# Patient Record
Sex: Male | Born: 1945 | Hispanic: No | Marital: Married | Smoking: Never smoker
Health system: Southern US, Community
[De-identification: ages and names within clinical notes are randomized; demographics above are authoritative.]

## PROBLEM LIST (undated history)

## (undated) DIAGNOSIS — M1990 Unspecified osteoarthritis, unspecified site: Secondary | ICD-10-CM

## (undated) DIAGNOSIS — K2210 Ulcer of esophagus without bleeding: Secondary | ICD-10-CM

## (undated) DIAGNOSIS — I2510 Atherosclerotic heart disease of native coronary artery without angina pectoris: Secondary | ICD-10-CM

## (undated) HISTORY — PX: BYPASS GRAFT: SHX909

## (undated) HISTORY — PX: PROSTATECTOMY: SHX69

## (undated) HISTORY — PX: CORONARY ANGIOPLASTY WITH STENT PLACEMENT: SHX49

---

## 2012-07-12 ENCOUNTER — Emergency Department (HOSPITAL_COMMUNITY)
Admission: EM | Admit: 2012-07-12 | Discharge: 2012-07-12 | Disposition: A | Discharge status: Home / Self Care | Payer: Medicare Other | Attending: Emergency Medicine | Admitting: Emergency Medicine

## 2012-07-12 ENCOUNTER — Encounter (HOSPITAL_COMMUNITY): Payer: Self-pay | Admitting: Emergency Medicine

## 2012-07-12 ENCOUNTER — Emergency Department (HOSPITAL_COMMUNITY): Payer: Medicare Other

## 2012-07-12 DIAGNOSIS — Z8739 Personal history of other diseases of the musculoskeletal system and connective tissue: Secondary | ICD-10-CM | POA: Insufficient documentation

## 2012-07-12 DIAGNOSIS — I2510 Atherosclerotic heart disease of native coronary artery without angina pectoris: Secondary | ICD-10-CM | POA: Insufficient documentation

## 2012-07-12 DIAGNOSIS — R112 Nausea with vomiting, unspecified: Secondary | ICD-10-CM | POA: Insufficient documentation

## 2012-07-12 DIAGNOSIS — Z8719 Personal history of other diseases of the digestive system: Secondary | ICD-10-CM | POA: Insufficient documentation

## 2012-07-12 DIAGNOSIS — Z951 Presence of aortocoronary bypass graft: Secondary | ICD-10-CM | POA: Insufficient documentation

## 2012-07-12 DIAGNOSIS — K8020 Calculus of gallbladder without cholecystitis without obstruction: Secondary | ICD-10-CM | POA: Insufficient documentation

## 2012-07-12 HISTORY — DX: Unspecified osteoarthritis, unspecified site: M19.90

## 2012-07-12 HISTORY — DX: Atherosclerotic heart disease of native coronary artery without angina pectoris: I25.10

## 2012-07-12 HISTORY — DX: Ulcer of esophagus without bleeding: K22.10

## 2012-07-12 LAB — CBC WITH DIFFERENTIAL/PLATELET
Basophils Relative: 0 % (ref 0–1)
Eosinophils Absolute: 0.2 10*3/uL (ref 0.0–0.7)
Eosinophils Relative: 2 % (ref 0–5)
HCT: 40.5 % (ref 39.0–52.0)
Hemoglobin: 13.4 g/dL (ref 13.0–17.0)
Lymphs Abs: 1.5 10*3/uL (ref 0.7–4.0)
MCH: 29.7 pg (ref 26.0–34.0)
MCHC: 33.1 g/dL (ref 30.0–36.0)
MCV: 89.8 fL (ref 78.0–100.0)
Monocytes Absolute: 0.5 10*3/uL (ref 0.1–1.0)
Monocytes Relative: 6 % (ref 3–12)
Neutrophils Relative %: 75 % (ref 43–77)
RBC: 4.51 MIL/uL (ref 4.22–5.81)

## 2012-07-12 LAB — COMPREHENSIVE METABOLIC PANEL
Albumin: 3.6 g/dL (ref 3.5–5.2)
Alkaline Phosphatase: 96 U/L (ref 39–117)
BUN: 18 mg/dL (ref 6–23)
Calcium: 8.7 mg/dL (ref 8.4–10.5)
Creatinine, Ser: 1.31 mg/dL (ref 0.50–1.35)
GFR calc Af Amer: 63 mL/min — ABNORMAL LOW (ref >90)
Glucose, Bld: 168 mg/dL — ABNORMAL HIGH (ref 70–99)
Potassium: 3.7 mEq/L (ref 3.5–5.1)
Total Protein: 6.4 g/dL (ref 6.0–8.3)

## 2012-07-12 LAB — URINALYSIS, ROUTINE W REFLEX MICROSCOPIC
Bilirubin Urine: NEGATIVE
Leukocytes, UA: NEGATIVE
Nitrite: NEGATIVE
Specific Gravity, Urine: 1.018 (ref 1.005–1.030)
Urobilinogen, UA: 0.2 mg/dL (ref 0.0–1.0)
pH: 5.5 (ref 5.0–8.0)

## 2012-07-12 LAB — LIPASE, BLOOD: Lipase: 38 U/L (ref 11–59)

## 2012-07-12 LAB — PROTIME-INR: Prothrombin Time: 19.7 seconds — ABNORMAL HIGH (ref 11.6–15.2)

## 2012-07-12 MED ORDER — MORPHINE SULFATE 2 MG/ML IJ SOLN
2.0000 mg | Freq: Once | INTRAMUSCULAR | Status: AC
  Administered 2012-07-12: 2 mg via INTRAVENOUS
  Filled 2012-07-12: qty 1

## 2012-07-12 MED ORDER — IOHEXOL 300 MG/ML  SOLN
50.0000 mL | Freq: Once | INTRAMUSCULAR | Status: AC | PRN
  Administered 2012-07-12: 50 mL via ORAL

## 2012-07-12 MED ORDER — OXYCODONE-ACETAMINOPHEN 5-325 MG PO TABS: 1.0000 | ORAL_TABLET | ORAL | Status: AC | PRN

## 2012-07-12 MED ORDER — GI COCKTAIL ~~LOC~~
30.0000 mL | Freq: Once | ORAL | Status: AC
  Administered 2012-07-12: 30 mL via ORAL
  Filled 2012-07-12: qty 30

## 2012-07-12 MED ORDER — ONDANSETRON HCL 4 MG/2ML IJ SOLN
4.0000 mg | Freq: Once | INTRAMUSCULAR | Status: AC
  Administered 2012-07-12: 4 mg via INTRAVENOUS
  Filled 2012-07-12: qty 2

## 2012-07-12 MED ORDER — SODIUM CHLORIDE 0.9 % IV SOLN
1000.0000 mL | Freq: Once | INTRAVENOUS | Status: AC
  Administered 2012-07-12: 1000 mL via INTRAVENOUS

## 2012-07-12 MED ORDER — HYDROCODONE-ACETAMINOPHEN 5-325 MG PO TABS: 1.0000 | ORAL_TABLET | Freq: Three times a day (TID) | ORAL | Status: AC | PRN

## 2012-07-12 MED ORDER — IOHEXOL 300 MG/ML  SOLN
80.0000 mL | Freq: Once | INTRAMUSCULAR | Status: AC | PRN
  Administered 2012-07-12: 80 mL via INTRAVENOUS

## 2012-07-12 NOTE — ED Notes (Signed)
Patient transported to CT 

## 2012-07-12 NOTE — ED Notes (Signed)
PT. REPORTS MID ABDOMINAL PAIN WITH VOMITTING ONSET LAST Sunday AND DIARRHEA YESTERDAY , CURRENTLY TAKING CIPRO AND FLAGYL ANTIBIOTIC PRESCRIBED AT AN URGENT CARE CLINIC.

## 2012-07-12 NOTE — ED Provider Notes (Signed)
History     CSN: [id number]  Arrival date & time 07/12/12  0409   First MD Initiated Contact with Patient 07/12/12 [phone]      Chief Complaint  Patient presents with  . Abdominal Pain    (Consider location/radiation/quality/duration/timing/severity/associated sxs/prior treatment) HPI The patient presents with concerns of abdominal pain, nausea, vomiting. Symptoms began approximately 72 hours ago, initially with crampy abdominal pain.  Subsequently the patient had multiple episodes of emesis and diarrhea.  That day he was seen at an urgent care center.  He was started on ciprofloxacin, Flagyl. He states that in the subsequent 2 days he continues to have crampy epigastric, left upper quadrant abdominal pain.  His appetite is improving minimally.  He continues to have nausea.  He has had 2 subsequent episodes of loose stool. He has had subjective fever, but no objective fever. No chest pain, no dyspnea, no lower extremity edema, no rash, no confusion, no disorientation. He is a distant history of appendectomy. He notes that his pain is improved since the initiation of medication, without other clear alleviating factors. There are no clear exacerbating factors.  Note the patient has a history of CAD, including with stent placement and bypass surgery.  For both of those events, the patient no chest pain, but did have abdominal pain. Past Medical History  Diagnosis Date  . DJD (degenerative joint disease)   . Erosive esophagitis     History reviewed. No pertinent past surgical history.  No family history on file.  History  Substance Use Topics  . Smoking status: Never Smoker   . Smokeless tobacco: Not on file  . Alcohol Use: No      Review of Systems  Constitutional:       Per HPI, otherwise negative  HENT:       Per HPI, otherwise negative  Eyes: Negative.   Respiratory:       Per HPI, otherwise negative  Cardiovascular:       Per HPI, otherwise negative    Gastrointestinal: Positive for nausea, vomiting and diarrhea.  Genitourinary: Negative.   Musculoskeletal:       Per HPI, otherwise negative  Skin: Negative.   Neurological: Negative for syncope.    Allergies  Review of patient's allergies indicates not on file.  Home Medications  No current outpatient prescriptions on file.  BP 133/67  Pulse 48  Temp 97.5 F (36.4 C) (Oral)  Resp 16  SpO2 100%  Physical Exam  Nursing note and vitals reviewed. Constitutional: He is oriented to person, place, and time. He appears well-developed. No distress.  HENT:  Head: Normocephalic and atraumatic.  Eyes: Conjunctivae normal and EOM are normal.  Cardiovascular: Normal rate and regular rhythm.   Pulmonary/Chest: Effort normal. No stridor. No respiratory distress.  Abdominal: He exhibits no distension.  Musculoskeletal: He exhibits no edema.  Neurological: He is alert and oriented to person, place, and time.  Skin: Skin is warm and dry.  Psychiatric: He has a normal mood and affect.    ED Course  Procedures (including critical care time)   Labs Reviewed  CBC WITH DIFFERENTIAL  COMPREHENSIVE METABOLIC PANEL  LIPASE, BLOOD  URINALYSIS, ROUTINE W REFLEX MICROSCOPIC  TROPONIN I   No results found.   No diagnosis found.  Cardiac 48 sinus bradycardia abnormal Pulse ox 100% room air normal   Date: 07/12/2012  Rate: 48  Rhythm: sinus bradycardia  QRS Axis: left  Intervals: normal  ST/T Wave abnormalities: nonspecific T wave changes  Conduction Disutrbances:none  Narrative Interpretation:   Old EKG Reviewed: none available ABNORMAL  Update: patient improved.  He was made aware of all results thus far.  He adds that the day prior to Sx onset he had a tuna sandwich, and subsequently felt mildly ill.  8:06 AM The patient continues to feel slightly unwell.  A review of his CT scan, which I interpreted, and demonstrated results to the patient suggest multiple ongoing issues.   Notable for today's presentation, there were abnormalities about the gallbladder.  With the change in his transaminases, ultrasound was ordered. We discussed pain management options, anti-emetics.  MDM  This patient presents with new abdominal pain, nausea, anorexia, vomiting.  On exam the patient is unwell appearing, but in no distress.  The patient has mild bradycardia, but otherwise unremarkable vital signs.  The patient is tenderness to palpation about the epigastric and left upper quadrant suggesting gastric or GI pathology.  The patient has no dysuria, no fever.  Patient's labs are notable for elevated transaminases, and a CT scan demonstrates nonspecific gallbladder changes.  Followup ultrasound was ordered.  This was largely unremarkable, the patient was discharged with analgesics, and GI F/U.        Gerhard Munch, MD 07/12/12 [phone]

## 2012-07-12 NOTE — ED Provider Notes (Addendum)
10:46 AM The patient is without a discomfort at this time.  On examination the patient has no epigastric or right upper quadrant tenderness.  Patient states he feels much better at this time.  I suspect this is biliary colic.  His white count is normal.  You will need followup with general surgery for possible cholecystectomy.  He understands return to ER for new or seen symptoms including but not limited to fever, worsening abdominal pain, severe nausea vomiting and inability to keep fluids down.  Patient is a mild elevation of his liver function tests and this may represent recently passed stone 2 as, mild left.  His common bile duct is normal size on ultrasound.  The encounter diagnosis was Cholelithiasis.  US Abdomen Complete  07/12/2012  *RADIOLOGY REPORT*  Clinical Data: Right upper quadrant pain.  ABDOMEN ULTRASOUND  Technique:  Complete abdominal ultrasound examination was performed including evaluation of the liver, gallbladder, bile ducts, pancreas, kidneys, spleen, IVC, and abdominal aorta.  Comparison: CT scan from 07/12/2012  Findings:  Gallbladder:  There is some dependent sludge within the lumen of the gallbladder.  Tiny stones measure up to about 6 mm in diameter. Gallbladder wall does not show evidence for thickening, measuring only about 2 mm.  There is a trace amount of fluid adjacent to the gallbladder.  The sonographer reports no sonographic Murphy's sign.  Common Bile Duct:  Nondilated at 6 mm diameter.  Liver:  Normal.  No focal parenchymal abnormality.  No biliary dilation.  IVC:  Normal.  Pancreas:  Normal.  Spleen:  Normal.  Right kidney:  10.8 cm in long axis.  16 mm hypoechoic lesion in the interpolar region is compatible with a cyst, as seen on the CT scan earlier today.  Left kidney:  10.6 cm in long axis.  Normal.  Abdominal Aorta:  2.9 cm in maximum diameter, borderline aneurysmal.  IMPRESSION: Gallbladder sludge with associated tiny gallstones.  Although no gallbladder wall  thickening is evident, the patient does have a trace amount of pericholecystic fluid.  The sonographer reports a negative sonographic Murphy's sign.  Overall ultrasound features are equivocal for acute cholecystitis.  If the clinical picture is nonspecific, nuclear scintigraphy may prove helpful to further evaluate.   Original Report Authenticated By: Kennith Center, M.D.    Ct Abdomen Pelvis W Contrast  07/12/2012  *RADIOLOGY REPORT*  Clinical Data: Epigastric and left upper quadrant pain, nausea, vomiting  CT ABDOMEN AND PELVIS WITH CONTRAST  Technique:  Multidetector CT imaging of the abdomen and pelvis was performed following the standard protocol during bolus administration of intravenous contrast.  Contrast: 80mL OMNIPAQUE IOHEXOL 300 MG/ML  SOLN  Comparison: None.  Findings:  Lower Chest:  Dependent atelectasis in the bilateral lower lobes. Isolated pulmonary cyst versus emphysematous bleb in the left lower lobe.  No suspicious pulmonary nodule.  Visualized cardiac structures within normal limits for size.  Atherosclerotic calcifications noted in the coronary arteries.  No pericardial effusion.  Unremarkable visualized distal esophagus.  Abdomen: Mild thickening of the anterior wall of the gastric antrum is less conspicuous on the delayed views suggest normal muscular contraction.  There is a small periampullary duodenal diverticulum. Normal for age appearance of the spleen, pancreas, adrenal glands, and liver.   Although no radiopaque cholelithiasis is identified. There is mild thickening of the anterior wall of the gallbladder. No significant extrahepatic biliary ductal dilatation or definite filling defect within the common bile duct.  There is minimal prominence of the right intrahepatic ducts of  uncertain clinical significance.  Right interpolar renal cyst.  Additional sub centimeter hypoattenuating lesions bilaterally are too small for accurate characterization but statistically likely also represent  benign cyst.  No hydronephrosis of either kidney.  No focal enhancing solid lesion.  No focal bowel wall thickening or bowel dilatation.  Although no appendix is identified in the right lower quadrant, there is no significant inflammatory change appendicitis.  No free fluid or suspicious adenopathy.  There are a few scattered colonic diverticula without evidence of active inflammation.  Pelvis: Unremarkable appearance of the bladder.  Status post prostatectomy and bilateral pelvic sidewall node dissection.  Trace fluid versus thickening in the rectovesicular recess.  Bones:  Although strictly age indeterminate without prior imaging for comparison.  Compression fractures involving the superior endplate of T10 and inferior endplate of L2 appear chronic in nature.  Remote healed left rib fractures.  Vascular: Scattered atherosclerotic vascular disease without aneurysmal dilatation or significant appearing stenosis.  No dissection or occlusion identified.  IMPRESSION:  1.  Nonspecific mild thickening of the anterior gallbladder wall without evidence of radiopaque cholelithiasis.  If there is clinical concern for underlying cholecystitis, right upper quadrant ultrasound could further evaluate.  2. Trace fluid versus thickening in the rectovesicular recess of uncertain clinical significance.  This may represent residual postoperative change from prior prostatectomy and bilateral pelvic sidewall nodal dissections.  Alternately, an early underlying infectious/inflammatory colitis could produce similar findings.  3. Compression fractures of the superior endplate of T10 and inferior endplate of L2 are strictly age indeterminate in the absence of prior imaging for comparison but appear chronic in nature.  4.  Atherosclerosis including coronary artery disease   Original Report Authenticated By: Malachy Moan, M.D.   I personally reviewed the imaging tests through PACS system I reviewed available ER/hospitalization records  through the EMR   Lyanne Co, MD 07/12/12 1047  Lyanne Co, MD 07/12/12 1051

## 2012-07-12 NOTE — ED Notes (Signed)
Patient transported to Ultrasound 

## 2012-07-12 NOTE — ED Notes (Signed)
Pt ambulated to restroom. 

## 2012-07-12 NOTE — ED Notes (Signed)
MD at bedside. 

## 2015-01-01 ENCOUNTER — Other Ambulatory Visit: Payer: Self-pay | Admitting: Geriatric Medicine

## 2015-01-01 ENCOUNTER — Ambulatory Visit
Admission: RE | Admit: 2015-01-01 | Discharge: 2015-01-01 | Disposition: A | Discharge status: Home / Self Care | Payer: Medicare Other | Source: Ambulatory Visit | Attending: Geriatric Medicine | Admitting: Geriatric Medicine

## 2015-01-01 DIAGNOSIS — M25552 Pain in left hip: Secondary | ICD-10-CM

## 2017-01-04 ENCOUNTER — Other Ambulatory Visit: Payer: Self-pay | Admitting: Specialist

## 2017-01-04 DIAGNOSIS — M7551 Bursitis of right shoulder: Secondary | ICD-10-CM

## 2017-01-15 ENCOUNTER — Ambulatory Visit
Admission: RE | Admit: 2017-01-15 | Discharge: 2017-01-15 | Disposition: A | Discharge status: Home / Self Care | Payer: Medicare Other | Source: Ambulatory Visit | Attending: Specialist | Admitting: Specialist

## 2017-01-15 DIAGNOSIS — M7551 Bursitis of right shoulder: Secondary | ICD-10-CM

## 2019-10-29 ENCOUNTER — Ambulatory Visit (INDEPENDENT_AMBULATORY_CARE_PROVIDER_SITE_OTHER): Payer: Medicare Other | Admitting: Otolaryngology

## 2019-10-29 ENCOUNTER — Encounter (INDEPENDENT_AMBULATORY_CARE_PROVIDER_SITE_OTHER): Payer: Self-pay | Admitting: Otolaryngology

## 2019-10-29 ENCOUNTER — Other Ambulatory Visit: Payer: Self-pay

## 2019-10-29 VITALS — Temp 97.9°F

## 2019-10-29 DIAGNOSIS — K219 Gastro-esophageal reflux disease without esophagitis: Secondary | ICD-10-CM

## 2019-10-29 DIAGNOSIS — J310 Chronic rhinitis: Secondary | ICD-10-CM | POA: Diagnosis not present

## 2019-10-29 DIAGNOSIS — R490 Dysphonia: Secondary | ICD-10-CM | POA: Diagnosis not present

## 2019-10-29 NOTE — Progress Notes (Signed)
HPI: Randall Watson is a 74 y.o. male who presents for evaluation of chronic hoarseness.  He apparently has had hoarseness for 7 or 8 years.  He does not smoke and quit smoking in 1977.  Denies sore throat.  He used to sing regularly but is unable to sing presently.  He had seen ENT and had fiberoptic laryngoscopy several years ago prior to Covid and they recommended speech therapy but he was unable to complete speech therapy. He denies sore throat but does have history of hiatal hernia and reflux problems. He also has history of allergies and postnasal drainage. He is presently using Claritin or Allegra.  He uses Flonase occasionally. He also takes an acid medication but not on a regular basis.  He is looking to buy omeprazole in the next couple of days. His wife wanted his voice checked because he has been chronically hoarse.  Past Medical History:  Diagnosis Date  . CAD (coronary artery disease)   . DJD (degenerative joint disease)   . Erosive esophagitis    Past Surgical History:  Procedure Laterality Date  . BYPASS GRAFT     triple bypass  . CORONARY ANGIOPLASTY WITH STENT PLACEMENT    . PROSTATECTOMY     Social History   Socioeconomic History  . Marital status: Married    Spouse name: Not on file  . Number of children: Not on file  . Years of education: Not on file  . Highest education level: Not on file  Occupational History  . Not on file  Tobacco Use  . Smoking status: Never Smoker  . Smokeless tobacco: Never Used  Substance and Sexual Activity  . Alcohol use: No  . Drug use: Not on file  . Sexual activity: Not on file  Other Topics Concern  . Not on file  Social History Narrative  . Not on file   Social Determinants of Health   Financial Resource Strain:   . Difficulty of Paying Living Expenses:   Food Insecurity:   . Worried About Charity fundraiser in the Last Year:   . Arboriculturist in the Last Year:   Transportation Needs:   . Lexicographer (Medical):   Marland Kitchen Lack of Transportation (Non-Medical):   Physical Activity:   . Days of Exercise per Week:   . Minutes of Exercise per Session:   Stress:   . Feeling of Stress :   Social Connections:   . Frequency of Communication with Friends and Family:   . Frequency of Social Gatherings with Friends and Family:   . Attends Religious Services:   . Active Member of Clubs or Organizations:   . Attends Archivist Meetings:   Marland Kitchen Marital Status:    No family history on file. Allergies  Allergen Reactions  . Demerol [Meperidine] Nausea And Vomiting  . Dilaudid [Hydromorphone Hcl] Nausea And Vomiting   Prior to Admission medications   Medication Sig Start Date End Date Taking? Authorizing Provider  aspirin 325 MG tablet Take 325 mg by mouth daily.    [provider]  atorvastatin (LIPITOR) 40 MG tablet Take 40 mg by mouth daily.    [provider]  carvedilol (COREG) 6.25 MG tablet Take 6.25 mg by mouth 2 (two) times daily with a meal.    [provider]  cetirizine (ZYRTEC) 10 MG tablet Take 10 mg by mouth at bedtime.    [provider]  HYDROcodone-acetaminophen (NORCO/VICODIN) 5-325 MG per tablet Take  1 tablet by mouth every 8 (eight) hours as needed for pain. 07/12/12   Gerhard Munch, MD  lansoprazole (PREVACID) 15 MG capsule Take 15 mg by mouth daily.    [provider]  lisinopril (PRINIVIL,ZESTRIL) 2.5 MG tablet Take 2.5 mg by mouth daily.    [provider]  Multiple Vitamin (MULTIVITAMIN WITH MINERALS) TABS Take 1 tablet by mouth daily.    [provider]  oxyCODONE-acetaminophen (PERCOCET/ROXICET) 5-325 MG per tablet Take 1 tablet by mouth every 4 (four) hours as needed for pain. 07/12/12   Azalia Bilis, MD  warfarin (COUMADIN) 5 MG tablet Take 5 mg by mouth daily.    [provider]     Positive ROS: Otherwise negative  All other systems have been reviewed and were otherwise  negative with the exception of those mentioned in the HPI and as above.  Physical Exam: Constitutional: Alert, well-appearing, no acute distress.  He does have a gravelly type voice. Ears: External ears without lesions or tenderness. Ear canals are clear bilaterally with intact, clear TMs.  Nasal: External nose without lesions. Septum midline.  He has had previous nasal surgery for nasal fracture. Clear nasal passages with no signs of infection. Fiberoptic laryngoscopy was performed through the right nostril.  On fiberoptic laryngoscopy nasopharynx was clear.  Base of tongue vallecula epiglottis were normal.  Vocal cords were clear bilaterally with normal vocal cord mobility.  There were no polyps no nodules.  He had mild arytenoid edema which may be indicative of reflux. Oral: Lips and gums without lesions. Tongue and palate mucosa without lesions. Posterior oropharynx clear. Neck: No palpable adenopathy or masses.  No palpable adenopathy in the neck. Respiratory: Breathing comfortably  Skin: No facial/neck lesions or rash noted.  Laryngoscopy  Date/Time: 10/29/2019 3:20 PM Performed by: Drema Halon, MD Authorized by: Drema Halon, MD   Consent:    Consent obtained:  Verbal   Consent given by:  Patient Procedure details:    Indications: hoarseness, dysphagia, or aspiration     Medication:  Afrin   Instrument: flexible fiberoptic laryngoscope     Scope location: right nare   Sinus:    Right nasopharynx: normal   Mouth:    Oropharynx: normal     Vallecula: normal     Base of tongue: normal     Epiglottis: normal   Throat:    True vocal cords: normal   Comments:     Vocal cords are clear bilaterally with normal vocal cord mobility.  Mild arytenoid edema.    Assessment: Hoarseness with minimal laryngeal findings. Laryngeal pharyngeal reflux disease. Chronic rhinitis  Plan: Recommended regular use of Flonase 2 sprays each nostril at night as well as use of  saline irrigation for postnasal drainage. Also suggested regular use of the omeprazole product daily before dinner for the next month. Would also recommend speech therapy.  Patient and wife presently living [place] and are looking to move to [place].  When they moved to [facility] they will call us to schedule appointment with speech therapy.  Narda Bonds, MD

## 2021-07-27 ENCOUNTER — Other Ambulatory Visit: Payer: Self-pay | Admitting: Physical Medicine and Rehabilitation

## 2021-07-27 DIAGNOSIS — M545 Low back pain, unspecified: Secondary | ICD-10-CM

## 2021-08-19 ENCOUNTER — Ambulatory Visit
Admission: RE | Admit: 2021-08-19 | Discharge: 2021-08-19 | Disposition: A | Discharge status: Home / Self Care | Payer: Medicare Other | Source: Ambulatory Visit | Attending: Physical Medicine and Rehabilitation | Admitting: Physical Medicine and Rehabilitation

## 2021-08-19 ENCOUNTER — Other Ambulatory Visit: Payer: Self-pay

## 2021-08-19 ENCOUNTER — Other Ambulatory Visit: Payer: Self-pay | Admitting: Physical Medicine and Rehabilitation

## 2021-08-19 DIAGNOSIS — M542 Cervicalgia: Secondary | ICD-10-CM

## 2021-08-19 DIAGNOSIS — M545 Low back pain, unspecified: Secondary | ICD-10-CM

## 2023-06-27 IMAGING — CT CT CERVICAL SPINE W/O CM
2 series · 10 of 14 positions shown, 12 images · non-contrast
Comparison: None.

CLINICAL DATA: Neck pain radiating to the left shoulder and arm
with numbness in left hand. History of prostate cancer.



[Series 2: cspine soft · axial · 0.27mm/px · z∈[+838,+960]mm · 5 of 93 slices shown, 7 images]
[im 16/93  soft-tissue]
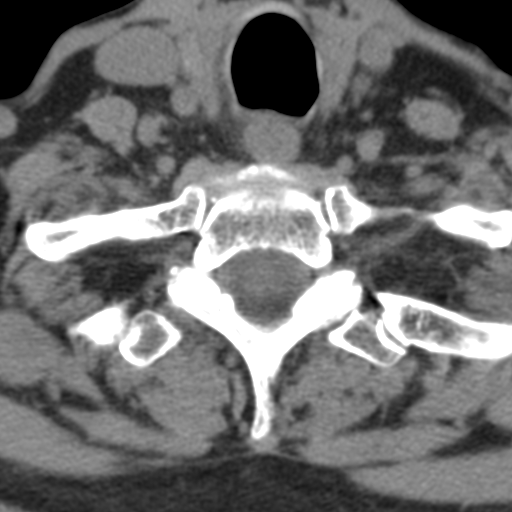
[im 16/93  bone]
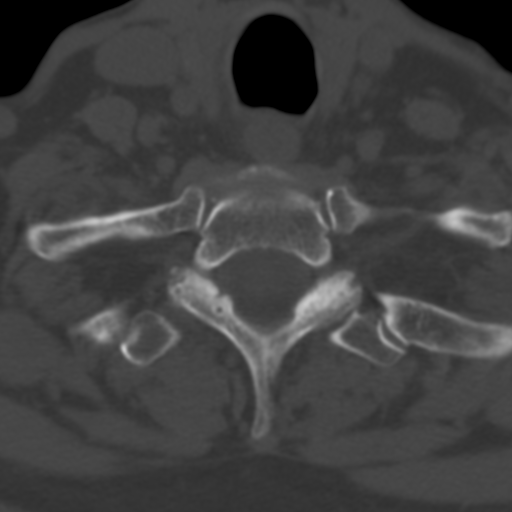
[im 31/93  bone]
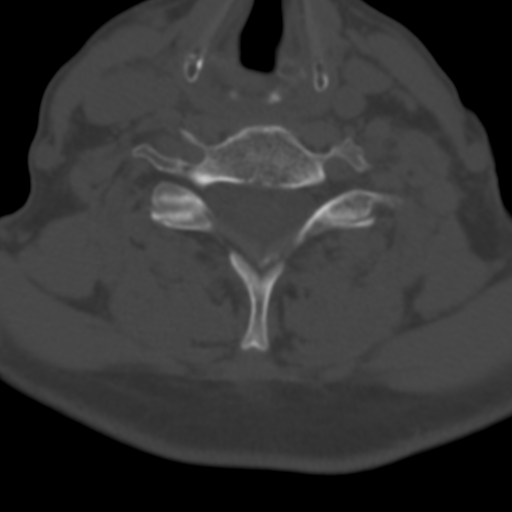
[im 47/93  bone]
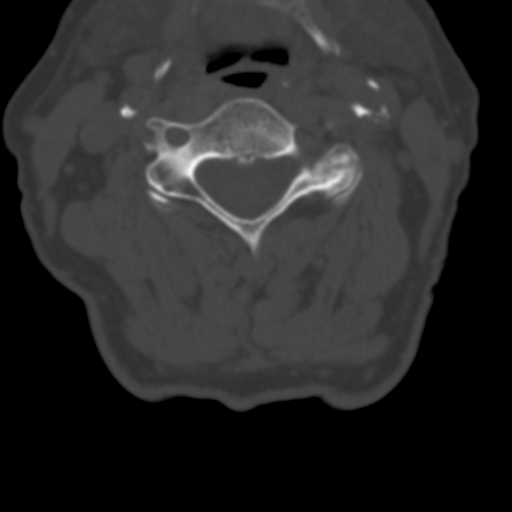
[im 62/93  bone]
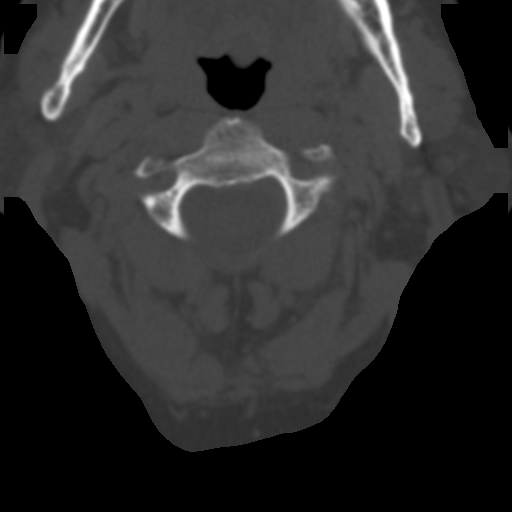
[im 77/93  soft-tissue]
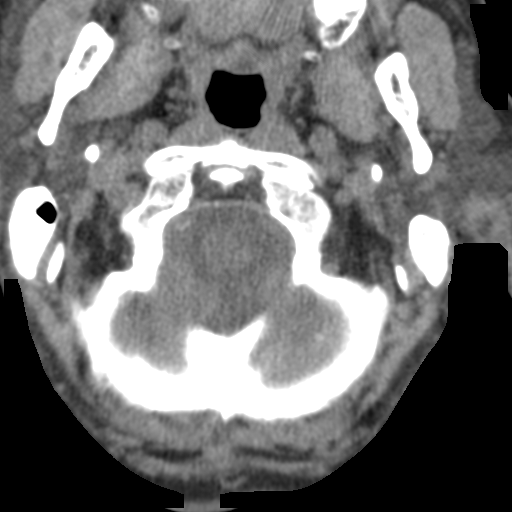
[im 77/93  bone]
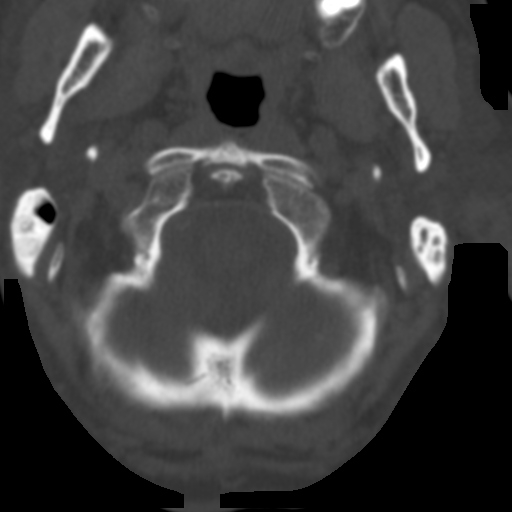

[Series 8: angled axial soft · axial · 0.22mm/px · z∈[+829,+950]mm · 5 of 93 slices shown]
[im 16/93  soft-tissue]
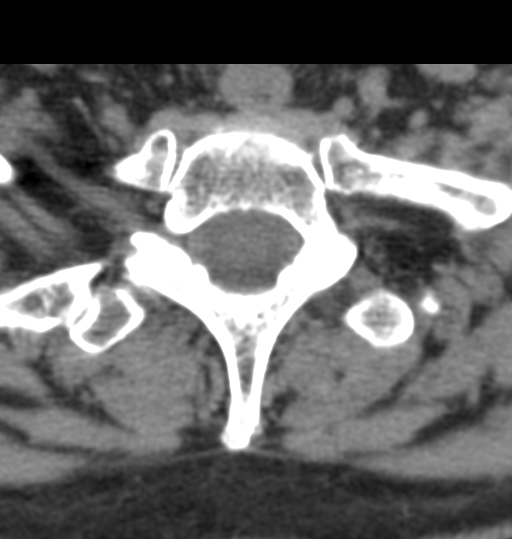
[im 31/93  soft-tissue]
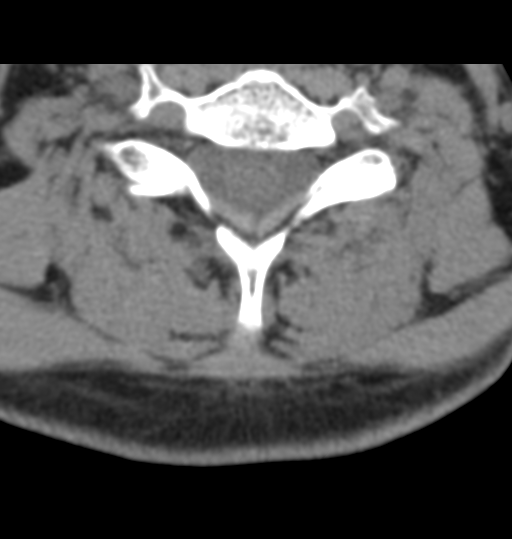
[im 47/93  soft-tissue]
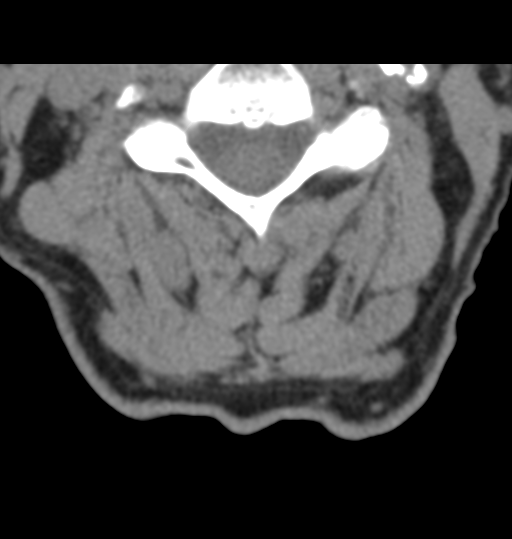
[im 62/93  soft-tissue]
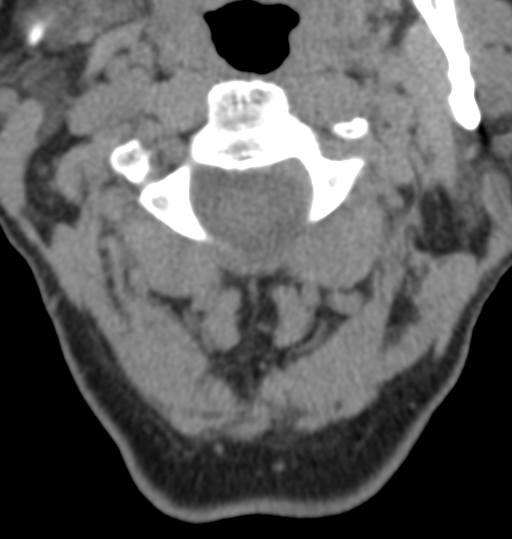
[im 77/93  soft-tissue]
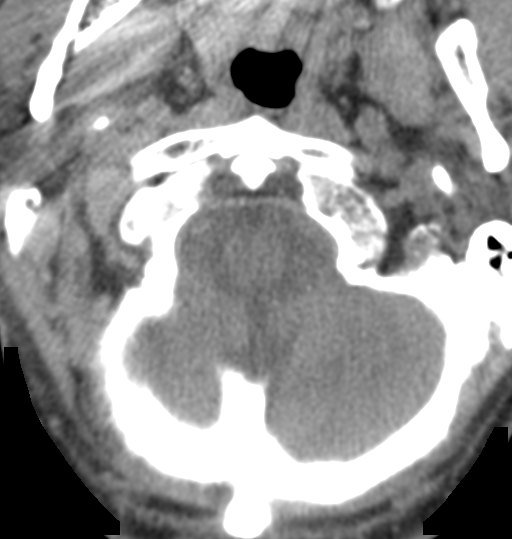

[10 of 14 positions shown; findings below may reference images not displayed]

FINDINGS: Alignment: Trace anterolisthesis of C2 on C3, C4 on C5, C5 on C6, C7
on T1, and T1 on T2. 3 mm retrolisthesis of C3 on C4.

Skull base and vertebrae: No fracture or suspicious osseous lesion.

Soft tissues and spinal canal: No prevertebral fluid or swelling. No
visible canal hematoma.

Disc levels:

C2-3: Moderate disc space narrowing. Mild disc bulging and moderate
left facet arthrosis without evidence of significant stenosis.

C3-4: Severe disc space narrowing with prominent degenerative
endplate changes and vacuum disc. Retrolisthesis, a broad-based
posterior disc osteophyte complex, and mild facet arthrosis result
in borderline spinal stenosis and borderline to mild right and
mild-to-moderate left neural foraminal stenosis.

C4-5: Minimal disc bulging, minimal uncovertebral spurring, and
moderate left facet arthrosis without evidence of significant
stenosis.

C5-6: Moderate disc space narrowing. Minimal disc bulging, minimal
uncovertebral spurring, and mild facet arthrosis without evidence of
significant stenosis.

C6-7: Minimal disc bulging without evidence of significant stenosis.

C7-T1: Negative.

Upper chest: Mild biapical pleuroparenchymal lung scarring.

Other: Moderate calcific atherosclerosis at the carotid
bifurcations.
IMPRESSION: 1. Multilevel cervical disc degeneration, most advanced at C3-4
where there is mild-to-moderate left neural foraminal stenosis.
2. No significant spinal canal stenosis.
